# Patient Record
Sex: Female | Born: 1975 | Race: White | Hispanic: No | Smoking: Current every day smoker
Health system: Southern US, Community
[De-identification: ages and names within clinical notes are randomized; demographics above are authoritative.]

## PROBLEM LIST (undated history)

## (undated) DIAGNOSIS — F1110 Opioid abuse, uncomplicated: Secondary | ICD-10-CM

## (undated) HISTORY — PX: APPENDECTOMY: SHX54

## (undated) HISTORY — PX: CHOLECYSTECTOMY: SHX55

## (undated) HISTORY — PX: KNEE SURGERY: SHX244

---

## 2007-04-30 ENCOUNTER — Emergency Department: Payer: Self-pay | Admitting: Emergency Medicine

## 2007-05-07 ENCOUNTER — Emergency Department (HOSPITAL_COMMUNITY): Admission: EM | Admit: 2007-05-07 | Discharge: 2007-05-07 | Payer: Self-pay | Admitting: Emergency Medicine

## 2015-01-27 ENCOUNTER — Emergency Department (HOSPITAL_COMMUNITY)
Admission: EM | Admit: 2015-01-27 | Discharge: 2015-01-27 | Disposition: A | Discharge status: Home / Self Care | Payer: Self-pay | Attending: Emergency Medicine | Admitting: Emergency Medicine

## 2015-01-27 ENCOUNTER — Emergency Department (HOSPITAL_COMMUNITY): Payer: Self-pay

## 2015-01-27 ENCOUNTER — Encounter (HOSPITAL_COMMUNITY): Payer: Self-pay

## 2015-01-27 DIAGNOSIS — Y998 Other external cause status: Secondary | ICD-10-CM | POA: Insufficient documentation

## 2015-01-27 DIAGNOSIS — Z9049 Acquired absence of other specified parts of digestive tract: Secondary | ICD-10-CM | POA: Insufficient documentation

## 2015-01-27 DIAGNOSIS — G8929 Other chronic pain: Secondary | ICD-10-CM | POA: Insufficient documentation

## 2015-01-27 DIAGNOSIS — T403X1A Poisoning by methadone, accidental (unintentional), initial encounter: Secondary | ICD-10-CM | POA: Insufficient documentation

## 2015-01-27 DIAGNOSIS — T50901A Poisoning by unspecified drugs, medicaments and biological substances, accidental (unintentional), initial encounter: Secondary | ICD-10-CM

## 2015-01-27 DIAGNOSIS — K2100 Gastro-esophageal reflux disease with esophagitis, without bleeding: Secondary | ICD-10-CM

## 2015-01-27 DIAGNOSIS — Z3202 Encounter for pregnancy test, result negative: Secondary | ICD-10-CM | POA: Insufficient documentation

## 2015-01-27 DIAGNOSIS — Y9289 Other specified places as the place of occurrence of the external cause: Secondary | ICD-10-CM | POA: Insufficient documentation

## 2015-01-27 DIAGNOSIS — Y9389 Activity, other specified: Secondary | ICD-10-CM | POA: Insufficient documentation

## 2015-01-27 HISTORY — DX: Opioid abuse, uncomplicated: F11.10

## 2015-01-27 LAB — COMPREHENSIVE METABOLIC PANEL
ALBUMIN: 4.3 g/dL (ref 3.5–5.0)
ALT: 23 U/L (ref 14–54)
AST: 23 U/L (ref 15–41)
Alkaline Phosphatase: 94 U/L (ref 38–126)
Anion gap: 9 (ref 5–15)
BUN: 17 mg/dL (ref 6–20)
CHLORIDE: 106 mmol/L (ref 101–111)
CO2: 24 mmol/L (ref 22–32)
CREATININE: 1.35 mg/dL — AB (ref 0.44–1.00)
Calcium: 9.3 mg/dL (ref 8.9–10.3)
GFR calc Af Amer: 58 mL/min — ABNORMAL LOW (ref >60)
GFR, EST NON AFRICAN AMERICAN: 50 mL/min — AB (ref >60)
Glucose, Bld: 108 mg/dL — ABNORMAL HIGH (ref 65–99)
POTASSIUM: 4.7 mmol/L (ref 3.5–5.1)
SODIUM: 139 mmol/L (ref 135–145)
Total Bilirubin: 0.4 mg/dL (ref 0.3–1.2)
Total Protein: 7.8 g/dL (ref 6.5–8.1)

## 2015-01-27 LAB — CBC WITH DIFFERENTIAL/PLATELET
Basophils Absolute: 0 10*3/uL (ref 0.0–0.1)
Basophils Relative: 0 %
EOS ABS: 0 10*3/uL (ref 0.0–0.7)
EOS PCT: 0 %
HCT: 44.9 % (ref 36.0–46.0)
HEMOGLOBIN: 14.2 g/dL (ref 12.0–15.0)
LYMPHS ABS: 2.5 10*3/uL (ref 0.7–4.0)
LYMPHS PCT: 27 %
MCH: 26.2 pg (ref 26.0–34.0)
MCHC: 31.6 g/dL (ref 30.0–36.0)
MCV: 83 fL (ref 78.0–100.0)
MONOS PCT: 8 %
Monocytes Absolute: 0.8 10*3/uL (ref 0.1–1.0)
NEUTROS PCT: 65 %
Neutro Abs: 6 10*3/uL (ref 1.7–7.7)
Platelets: 245 10*3/uL (ref 150–400)
RBC: 5.41 MIL/uL — AB (ref 3.87–5.11)
RDW: 15.1 % (ref 11.5–15.5)
WBC: 9.4 10*3/uL (ref 4.0–10.5)

## 2015-01-27 LAB — BLOOD GAS, VENOUS
ACID-BASE DEFICIT: 7.5 mmol/L — AB (ref 0.0–2.0)
ACID-BASE DEFICIT: 4.2 mmol/L — AB (ref 0.0–2.0)
Acid-base deficit: 5.8 mmol/L — ABNORMAL HIGH (ref 0.0–2.0)
BICARBONATE: 24.6 meq/L — AB (ref 20.0–24.0)
BICARBONATE: 22.3 meq/L (ref 20.0–24.0)
Bicarbonate: 24.1 mEq/L — ABNORMAL HIGH (ref 20.0–24.0)
O2 SAT: 92.9 %
O2 SAT: 92.6 %
O2 Saturation: 96.6 %
PATIENT TEMPERATURE: 98.6
PCO2 VEN: 69.6 mmHg — AB (ref 45.0–50.0)
PCO2 VEN: 84.1 mmHg — AB (ref 45.0–50.0)
PCO2 VEN: 48.5 mmHg (ref 45.0–50.0)
PH VEN: 7.165 — AB (ref 7.250–7.300)
PO2 VEN: 92.4 mmHg — AB (ref 30.0–45.0)
PO2 VEN: 70.8 mmHg — AB (ref 30.0–45.0)
Patient temperature: 98.6
Patient temperature: 98.6
TCO2: 22.7 mmol/L (ref 0–100)
TCO2: 23.7 mmol/L (ref 0–100)
TCO2: 20.4 mmol/L (ref 0–100)
pH, Ven: 7.094 — CL (ref 7.250–7.300)
pH, Ven: 7.283 (ref 7.250–7.300)
pO2, Ven: 115 mmHg — ABNORMAL HIGH (ref 30.0–45.0)

## 2015-01-27 LAB — URINALYSIS, ROUTINE W REFLEX MICROSCOPIC
Bilirubin Urine: NEGATIVE
GLUCOSE, UA: NEGATIVE mg/dL
HGB URINE DIPSTICK: NEGATIVE
Ketones, ur: NEGATIVE mg/dL
LEUKOCYTES UA: NEGATIVE
Nitrite: NEGATIVE
PH: 5 (ref 5.0–8.0)
Protein, ur: NEGATIVE mg/dL
Specific Gravity, Urine: 1.012 (ref 1.005–1.030)
Urobilinogen, UA: 0.2 mg/dL (ref 0.0–1.0)

## 2015-01-27 LAB — I-STAT CG4 LACTIC ACID, ED: LACTIC ACID, VENOUS: 1.04 mmol/L (ref 0.5–2.0)

## 2015-01-27 LAB — I-STAT BETA HCG BLOOD, ED (MC, WL, AP ONLY): I-stat hCG, quantitative: <5 m[IU]/mL (ref <5)

## 2015-01-27 LAB — LIPASE, BLOOD: Lipase: 37 U/L (ref 11–51)

## 2015-01-27 MED ORDER — INFLUENZA VAC SPLIT QUAD 0.5 ML IM SUSY: 0.5000 mL | PREFILLED_SYRINGE | INTRAMUSCULAR | Status: DC

## 2015-01-27 MED ORDER — NALOXONE HCL 2 MG/2ML IJ SOSY
0.2500 mg/h | PREFILLED_SYRINGE | INTRAMUSCULAR | Status: DC
  Filled 2015-01-27: qty 4

## 2015-01-27 MED ORDER — PANTOPRAZOLE SODIUM 40 MG IV SOLR
40.0000 mg | Freq: Once | INTRAVENOUS | Status: AC
  Administered 2015-01-27: 40 mg via INTRAVENOUS
  Filled 2015-01-27: qty 40

## 2015-01-27 MED ORDER — NALOXONE HCL 0.4 MG/ML IJ SOLN
0.2000 mg | Freq: Once | INTRAMUSCULAR | Status: AC
  Administered 2015-01-27: 0.2 mg via INTRAVENOUS
  Filled 2015-01-27: qty 1

## 2015-01-27 MED ORDER — SODIUM CHLORIDE 0.9 % IV BOLUS (SEPSIS)
1000.0000 mL | Freq: Once | INTRAVENOUS | Status: AC
  Administered 2015-01-27: 1000 mL via INTRAVENOUS

## 2015-01-27 MED ORDER — IOHEXOL 300 MG/ML  SOLN
50.0000 mL | Freq: Once | INTRAMUSCULAR | Status: AC | PRN
  Administered 2015-01-27: 50 mL via ORAL

## 2015-01-27 MED ORDER — PNEUMOCOCCAL VAC POLYVALENT 25 MCG/0.5ML IJ INJ: 0.5000 mL | INJECTION | INTRAMUSCULAR | Status: DC

## 2015-01-27 MED ORDER — PANTOPRAZOLE SODIUM 20 MG PO TBEC: 20.0000 mg | DELAYED_RELEASE_TABLET | Freq: Every day | ORAL | Status: AC

## 2015-01-27 MED ORDER — ONDANSETRON HCL 4 MG/2ML IJ SOLN
4.0000 mg | Freq: Once | INTRAMUSCULAR | Status: AC
  Administered 2015-01-27: 4 mg via INTRAVENOUS
  Filled 2015-01-27: qty 2

## 2015-01-27 NOTE — ED Notes (Signed)
Pt ambulatory to the BR and back without difficulty; pt with steady gait

## 2015-01-27 NOTE — ED Notes (Signed)
Per EMS, pt from methadone clinic.  Pt x 2 years.  Pt went this morning for dosing.  Pt started vomiting after her dose.  Pt then started c/o abdominal pain.  Pt states it has been going on x 2 weeks.  Pt is prescribed xanax but denies narcotic use.  Pt is arousable to voice.  Very lethargic in route and nods off during conversation.  Vitals: 122/84, hr 88, cbg 95, 98% ra

## 2015-01-27 NOTE — Discharge Instructions (Signed)
Gastroesophageal Reflux Disease, Adult Normally, food travels down the esophagus and stays in the stomach to be digested. However, when a person has gastroesophageal reflux disease (GERD), food and stomach acid move back up into the esophagus. When this happens, the esophagus becomes sore and inflamed. Over time, GERD can create small holes (ulcers) in the lining of the esophagus.  CAUSES This condition is caused by a problem with the muscle between the esophagus and the stomach (lower esophageal sphincter, or LES). Normally, the LES muscle closes after food passes through the esophagus to the stomach. When the LES is weakened or abnormal, it does not close properly, and that allows food and stomach acid to go back up into the esophagus. The LES can be weakened by certain dietary substances, medicines, and medical conditions, including:  Tobacco use.  Pregnancy.  Having a hiatal hernia.  Heavy alcohol use.  Certain foods and beverages, such as coffee, chocolate, onions, and peppermint. RISK FACTORS This condition is more likely to develop in:  People who have an increased body weight.  People who have connective tissue disorders.  People who use NSAID medicines. SYMPTOMS Symptoms of this condition include:  Heartburn.  Difficult or painful swallowing.  The feeling of having a lump in the throat.  Abitter taste in the mouth.  Bad breath.  Having a large amount of saliva.  Having an upset or bloated stomach.  Belching.  Chest pain.  Shortness of breath or wheezing.  Ongoing (chronic) cough or a night-time cough.  Wearing away of tooth enamel.  Weight loss. Different conditions can cause chest pain. Make sure to see your health care provider if you experience chest pain. DIAGNOSIS Your health care provider will take a medical history and perform a physical exam. To determine if you have mild or severe GERD, your health care provider may also monitor how you respond  to treatment. You may also have other tests, including:  An endoscopy toexamine your stomach and esophagus with a small camera.  A test thatmeasures the acidity level in your esophagus.  A test thatmeasures how much pressure is on your esophagus.  A barium swallow or modified barium swallow to show the shape, size, and functioning of your esophagus. TREATMENT The goal of treatment is to help relieve your symptoms and to prevent complications. Treatment for this condition may vary depending on how severe your symptoms are. Your health care provider may recommend:  Changes to your diet.  Medicine.  Surgery. HOME CARE INSTRUCTIONS Diet  Follow a diet as recommended by your health care provider. This may involve avoiding foods and drinks such as:  Coffee and tea (with or without caffeine).  Drinks that containalcohol.  Energy drinks and sports drinks.  Carbonated drinks or sodas.  Chocolate and cocoa.  Peppermint and mint flavorings.  Garlic and onions.  Horseradish.  Spicy and acidic foods, including peppers, chili powder, curry powder, vinegar, hot sauces, and barbecue sauce.  Citrus fruit juices and citrus fruits, such as oranges, lemons, and limes.  Tomato-based foods, such as red sauce, chili, salsa, and pizza with red sauce.  Fried and fatty foods, such as donuts, french fries, potato chips, and high-fat dressings.  High-fat meats, such as hot dogs and fatty cuts of red and white meats, such as rib eye steak, sausage, ham, and bacon.  High-fat dairy items, such as whole milk, butter, and cream cheese.  Eat small, frequent meals instead of large meals.  Avoid drinking large amounts of liquid with your  meals.  Avoid eating meals during the 2-3 hours before bedtime.  Avoid lying down right after you eat.  Do not exercise right after you eat. General Instructions  Pay attention to any changes in your symptoms.  Take over-the-counter and prescription  medicines only as told by your health care provider. Do not take aspirin, ibuprofen, or other NSAIDs unless your health care provider told you to do so.  Do not use any tobacco products, including cigarettes, chewing tobacco, and e-cigarettes. If you need help quitting, ask your health care provider.  Wear loose-fitting clothing. Do not wear anything tight around your waist that causes pressure on your abdomen.  Raise (elevate) the head of your bed 6 inches (15cm).  Try to reduce your stress, such as with yoga or meditation. If you need help reducing stress, ask your health care provider.  If you are overweight, reduce your weight to an amount that is healthy for you. Ask your health care provider for guidance about a safe weight loss goal.  Keep all follow-up visits as told by your health care provider. This is important. SEEK MEDICAL CARE IF:  You have new symptoms.  You have unexplained weight loss.  You have difficulty swallowing, or it hurts to swallow.  You have wheezing or a persistent cough.  Your symptoms do not improve with treatment.  You have a hoarse voice. SEEK IMMEDIATE MEDICAL CARE IF:  You have pain in your arms, neck, jaw, teeth, or back.  You feel sweaty, dizzy, or light-headed.  You have chest pain or shortness of breath.  You vomit and your vomit looks like blood or coffee grounds.  You faint.  Your stool is bloody or black.  You cannot swallow, drink, or eat.   This information is not intended to replace advice given to you by your health care provider. Make sure you discuss any questions you have with your health care provider.   Document Released: 12/13/2004 Document Revised: 11/24/2014 Document Reviewed: 06/30/2014 Elsevier Interactive Patient Education 2016 [employer]  Esophagitis Esophagitis is inflammation of the esophagus. The esophagus is the tube that carries food and liquids from your mouth to your stomach. Esophagitis can cause  soreness or pain in the esophagus. This condition can make it difficult and painful to swallow.  CAUSES Most causes of esophagitis are not serious. Common causes of this condition include:  Gastroesophageal reflux disease (GERD). This is when stomach contents move back up into the esophagus (reflux).  Repeated vomiting.  An allergic-type reaction, especially caused by food allergies (eosinophilic esophagitis).  Injury to the esophagus by swallowing large pills with or without water, or swallowing certain types of medicines.  Swallowing (ingesting) harmful chemicals, such as household cleaning products.  Heavy alcohol use.  An infection of the esophagus.This most often occurs in people who have a weakened immune system.  Radiation or chemotherapy treatment for cancer.  Certain diseases such as sarcoidosis, Crohn disease, and scleroderma. SYMPTOMS Symptoms of this condition include:  Difficult or painful swallowing.  Pain with swallowing acidic liquids, such as citrus juices.  Pain with burping.  Chest pain.  Difficulty breathing.  Nausea.  Vomiting.  Pain in the abdomen.  Weight loss.  Ulcers in the mouth.  Patches of white material in the mouth (candidiasis).  Fever.  Coughing up blood or vomiting blood.  Stool that is black, tarry, or bright red. DIAGNOSIS Your health care provider will take a medical history and perform a physical exam. You may also have other  tests, including:  An endoscopy to examine your stomach and esophagus with a small camera.  A test that measures the acidity level in your esophagus.  A test that measures how much pressure is on your esophagus.  A barium swallow or modified barium swallow to show the shape, size, and functioning of your esophagus.  Allergy tests. TREATMENT Treatment for this condition depends on the cause of your esophagitis. In some cases, steroids or other medicines may be given to help relieve your symptoms  or to treat the underlying cause of your condition. You may have to make some lifestyle changes, such as:  Avoiding alcohol.  Quitting smoking.  Changing your diet.  Exercising.  Changing your sleep habits and your sleep environment. HOME CARE INSTRUCTIONS Take these actions to decrease your discomfort and to help avoid complications. Diet  Follow a diet as recommended by your health care provider. This may involve avoiding foods and drinks such as:  Coffee and tea (with or without caffeine).  Drinks that contain alcohol.  Energy drinks and sports drinks.  Carbonated drinks or sodas.  Chocolate and cocoa.  Peppermint and mint flavorings.  Garlic and onions.  Horseradish.  Spicy and acidic foods, including peppers, chili powder, curry powder, vinegar, hot sauces, and barbecue sauce.  Citrus fruit juices and citrus fruits, such as oranges, lemons, and limes.  Tomato-based foods, such as red sauce, chili, salsa, and pizza with red sauce.  Fried and fatty foods, such as donuts, french fries, potato chips, and high-fat dressings.  High-fat meats, such as hot dogs and fatty cuts of red and white meats, such as rib eye steak, sausage, ham, and bacon.  High-fat dairy items, such as whole milk, butter, and cream cheese.  Eat small, frequent meals instead of large meals.  Avoid drinking large amounts of liquid with your meals.  Avoid eating meals during the 2-3 hours before bedtime.  Avoid lying down right after you eat.  Do not exercise right after you eat.  Avoid foods and drinks that seem to make your symptoms worse. General Instructions  Pay attention to any changes in your symptoms.  Take over-the-counter and prescription medicines only as told by your health care provider. Do not take aspirin, ibuprofen, or other NSAIDs unless your health care provider told you to do so.  If you have trouble taking pills, use a pill splitter to decrease the size of the pill.  This will decrease the chance of the pill getting stuck or injuring your esophagus on the way down. Also, drink water after you take a pill.  Do not use any tobacco products, including cigarettes, chewing tobacco, and e-cigarettes. If you need help quitting, ask your health care provider.  Wear loose-fitting clothing. Do not wear anything tight around your waist that causes pressure on your abdomen.  Raise (elevate) the head of your bed about 6 inches (15 cm).  Try to reduce your stress, such as with yoga or meditation. If you need help reducing stress, ask your health care provider.  If you are overweight, reduce your weight to an amount that is healthy for you. Ask your health care provider for guidance about a safe weight loss goal.  Keep all follow-up visits as told by your health care provider. This is important. SEEK MEDICAL CARE IF:  You have new symptoms.  You have unexplained weight loss.  You have difficulty swallowing, or it hurts to swallow.  You have wheezing or a persistent cough.  Your symptoms do  not improve with treatment.  You have frequent heartburn for more than two weeks. SEEK IMMEDIATE MEDICAL CARE IF:  You have severe pain in your arms, neck, jaw, teeth, or back.  You feel sweaty, dizzy, or light-headed.  You have chest pain or shortness of breath.  You vomit and your vomit looks like blood or coffee grounds.  Your stool is bloody or black.  You have a fever.  You cannot swallow, drink, or eat.   This information is not intended to replace advice given to you by your health care provider. Make sure you discuss any questions you have with your health care provider.   Document Released: 04/12/2004 Document Revised: 11/24/2014 Document Reviewed: 06/30/2014 Elsevier Interactive Patient Education [employer]

## 2015-01-27 NOTE — ED Provider Notes (Signed)
Care assumed from Dr. Madilyn Hookees at shift change. Patient was brought here after a double dose of her methadone. She was somnolent for several hours, however is now wide awake and ambulatory throughout the department. She will be discharged with the instructions provided by Dr. Madilyn Hookees. To follow-up as needed for any problems.  Maria Lyonsouglas Areej Tayler, MD 01/27/15 2042

## 2015-01-27 NOTE — ED Notes (Signed)
Bed: WA02 Expected date: 01/27/15 Expected time:  Means of arrival:  Comments: EMS - 39 y.o. f w/abd pain

## 2015-01-27 NOTE — ED Notes (Signed)
Pt sitting up on stretcher eating dinner, pt awake and alert presently

## 2015-01-27 NOTE — ED Provider Notes (Signed)
CSN: [id number]     Arrival date & time 01/27/15  0911 History   First MD Initiated Contact with Patient 01/27/15 [phone]     Chief Complaint  Patient presents with  . Abdominal Pain  . Emesis     Patient is a 39 y.o. female presenting with abdominal pain and vomiting. The history is provided by the patient. No language interpreter was used.  Abdominal Pain Associated symptoms: vomiting   Emesis Associated symptoms: abdominal pain    Ms. Perine presents for evaluation of abdominal pain. She was at the methadone clinic this morning and received her regular dose of 160 mg. She vomited he was given a second dose of 60 mg. She was referred to the emergency department for further evaluation of her abdominal pain. Level 5 caveat due to somnolence. Patient falls asleep during history and examination multiple times. She reports several months of upper abdominal pain that comes and goes. Has a history of cholecystectomy and appendectomy.  Past Medical History  Diagnosis Date  . Narcotic abuse    Past Surgical History  Procedure Laterality Date  . Appendectomy    . Cholecystectomy    . Knee surgery     No family history on file. Social History  Substance Use Topics  . Smoking status: Not on file  . Smokeless tobacco: Not on file  . Alcohol Use: Not on file   OB History    No data available     Review of Systems  Gastrointestinal: Positive for vomiting and abdominal pain.  All other systems reviewed and are negative.     Allergies  Keflex and Sulfa antibiotics  Home Medications   Prior to Admission medications   Not on File   BP 116/75 mmHg  Pulse 80  Resp 18  SpO2 97% Physical Exam  Constitutional: She appears well-developed and well-nourished.  HENT:  Head: Normocephalic and atraumatic.  Cardiovascular: Normal rate and regular rhythm.   No murmur heard. Pulmonary/Chest: Effort normal and breath sounds normal. No respiratory distress.  Abdominal:  Moderate right  upper quadrant and epigastric tenderness with voluntary guarding  Musculoskeletal: She exhibits no edema or tenderness.  Neurological:  Lethargic, moves all extremities symmetrically.  Skin: Skin is warm and dry.  Psychiatric: She has a normal mood and affect. Her behavior is normal.  Nursing note and vitals reviewed.   ED Course  Procedures  CRITICAL CARE Performed by: Tilden Fossa   Total critical care time: 30 minutes  Critical care time was exclusive of separately billable procedures and treating other patients.  Critical care was necessary to treat or prevent imminent or life-threatening deterioration.  Critical care was time spent personally by me on the following activities: development of treatment plan with patient and/or surrogate as well as nursing, discussions with consultants, evaluation of patient's response to treatment, examination of patient, obtaining history from patient or surrogate, ordering and performing treatments and interventions, ordering and review of laboratory studies, ordering and review of radiographic studies, pulse oximetry and re-evaluation of patient's condition.  Labs Review Labs Reviewed  COMPREHENSIVE METABOLIC PANEL - Abnormal; Notable for the following:    Glucose, Bld 108 (*)    Creatinine, Ser 1.35 (*)    GFR calc non Af Amer 50 (*)    GFR calc Af Amer 58 (*)    All other components within normal limits  CBC WITH DIFFERENTIAL/PLATELET - Abnormal; Notable for the following:    RBC 5.41 (*)    All other components within  normal limits  BLOOD GAS, VENOUS - Abnormal; Notable for the following:    pH, Ven 7.165 (*)    pCO2, Ven 69.6 (*)    pO2, Ven 115.0 (*)    Bicarbonate 24.1 (*)    Acid-base deficit 5.8 (*)    All other components within normal limits  BLOOD GAS, VENOUS - Abnormal; Notable for the following:    pH, Ven 7.094 (*)    pCO2, Ven 84.1 (*)    pO2, Ven 92.4 (*)    Bicarbonate 24.6 (*)    Acid-base deficit 7.5 (*)     All other components within normal limits  BLOOD GAS, VENOUS - Abnormal; Notable for the following:    pO2, Ven 70.8 (*)    Acid-base deficit 4.2 (*)    All other components within normal limits  LIPASE, BLOOD  URINALYSIS, ROUTINE W REFLEX MICROSCOPIC (NOT AT [facility])  I-STAT BETA HCG BLOOD, ED ([facility], [facility], [facility] ONLY)  I-STAT CG4 LACTIC ACID, ED    Imaging Review Ct Abdomen Pelvis Wo Contrast  01/27/2015  CLINICAL DATA:  39 year old with 2 week history of right upper quadrant and epigastric abdominal pain. Current history of narcotic abuse for which the patient has been on methadone therapy for 2 years. EXAM: CT ABDOMEN AND PELVIS WITHOUT CONTRAST TECHNIQUE: Multidetector CT imaging of the abdomen and pelvis was performed following the standard protocol without IV contrast. COMPARISON:  None. FINDINGS: Lower chest:  Heart size normal.  Visualized lung bases clear. Hepatobiliary: Normal unenhanced appearance of the liver. Gallbladder surgically absent. No unexpected biliary ductal dilation. Pancreas: Normal unenhanced appearance. Spleen: Normal unenhanced appearance. Adrenals/Urinary Tract: Normal adrenal glands. No evidence of urinary tract calculi or obstruction on either side. Within the limits of the unenhanced technique, no focal parenchymal abnormality involving either kidney. Normal-appearing decompressed urinary bladder. Stomach/Bowel: Small hiatal hernia. Thickening of the wall of the distal esophagus at the esophagogastric junction. Normal-appearing small bowel. Normal-appearing colon with expected stool burden. Surgically absent appendix. No ascites. Vascular/Lymphatic: No pathologic lymphadenopathy. No visible aortoiliofemoral atherosclerosis. Reproductive: Normal-appearing uterus and ovaries without evidence of adnexal mass. Other: None. Musculoskeletal: Regional skeleton intact without acute or significant osseous abnormality. IMPRESSION: 1. Wall thickening involving the distal esophagus at the  esophagogastric junction indicating esophagitis and/or chronic GE reflux disease. 2. Small hiatal hernia. 3. No acute abnormalities involving the abdomen or pelvis. Electronically Signed   By: Hulan Saashomas  Lawrence M.D.   On: 01/27/2015 13:51   I have personally reviewed and evaluated these images and lab results as part of my medical decision-making.   EKG Interpretation None      MDM   Final diagnoses:  Accidental overdose, initial encounter  Gastroesophageal reflux disease with esophagitis    Patient here for methadone clinic for evaluation of acute on chronic abdominal pain. She was just a second time at the methadone clinic and significantly somnolent on examination. Initially she was arousable to verbal stimuli but became increasingly somnolent with hypercapnia and hypoxia. She was given Narcan for reversal. Following the Narcan patient's conversant but would fall asleep at times. She would continue to respond to verbal stimuli. Again to observe in the emergency department for awakening. In terms of her abdominal pain it is difficult to fully evaluate as patient is inconsistent in her history. CT scan demonstrates esophagitis, discussed the patient findings and need for outpatient follow-up, starting on Protonix. Discussed outpatient follow-up, return precautions.    Tilden FossaElizabeth Jaelah Hauth, MD 01/28/15 [phone]
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
## 2015-01-27 NOTE — Progress Notes (Signed)
Patient listed as not having a pcp or insurance.  EDCM spoke to patient at bedside.  [place]  Patient [facility].  [facility]  Patient reports the only medications she has are through her psychiatrist and they are covered with a three dollar program.  Patient also reports her mother assists with the costs of her medications.  Patient reports she does not have any trouble with transportation because she drives.  Patient reports she cannot afford [employer] care and has tried applying for Medicaid but was not approved.  No further EDCM needs at this time.

## 2015-01-27 NOTE — ED Notes (Signed)
[phone] Loraine LericheMark (friend)

## 2015-01-27 NOTE — ED Notes (Signed)
Placed pt on 2L of O2. Pt sats were dropping to mid 80s when she would fall asleep.

## 2015-01-27 NOTE — ED Notes (Signed)
Pt carried on a conversation w/ this RN while Protonix was being administered.  Pt immediately slumped over and fell asleep when this RN left the room.  Pt easily arousable, but will immediately fall back to sleep w/o stimulation.

## 2015-01-27 NOTE — ED Notes (Signed)
Pt given crackers, cheese, and Coke, per MD.
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
## 2015-01-27 NOTE — ED Notes (Addendum)
Case Management at bedside.

## 2015-01-27 NOTE — ED Notes (Signed)
Bed: WA16 Expected date:  Expected time:  Means of arrival:  Comments: RES A 

## 2015-01-27 NOTE — ED Notes (Signed)
Pt ambulated to restroom w/o difficulty.

## 2015-01-27 NOTE — ED Notes (Signed)
Pt provided ice chips per MD

## 2017-05-06 IMAGING — CT CT ABD-PELV W/O CM
2 of 4 series · 16 of 46 positions shown, 18 images · non-contrast
Comparison: None.

CLINICAL DATA: 38-year-old with 2 week history of right upper
quadrant and epigastric abdominal pain. Current history of narcotic
abuse for which the patient has been on methadone therapy for 2
years.

EXAM:
CT ABDOMEN AND PELVIS WITHOUT CONTRAST
TECHNIQUE: Multidetector CT imaging of the abdomen and pelvis was performed
following the standard protocol without IV contrast.

[Series 2: abd/pel w/o · axial · non-contrast · 0.68mm/px · z∈[-429,-24]mm · 13 of 89 slices shown, 15 images]
[im 4/89  soft-tissue]
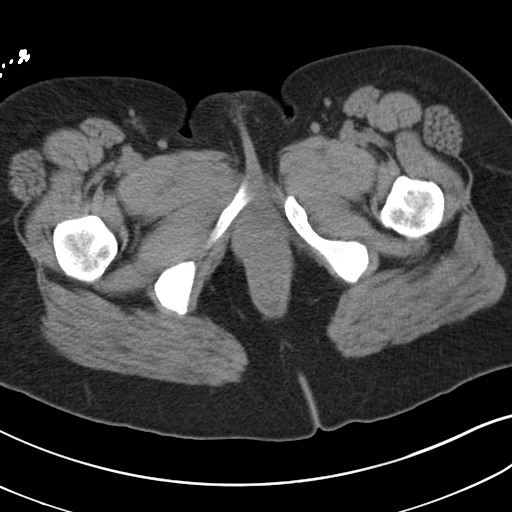
[im 4/89  bone]
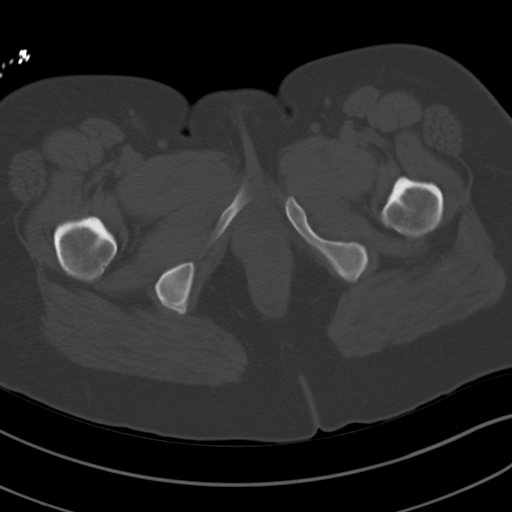
[im 12/89  soft-tissue]
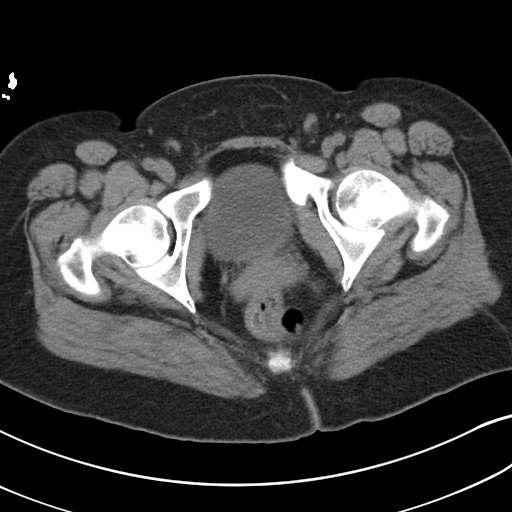
[im 19/89  soft-tissue]
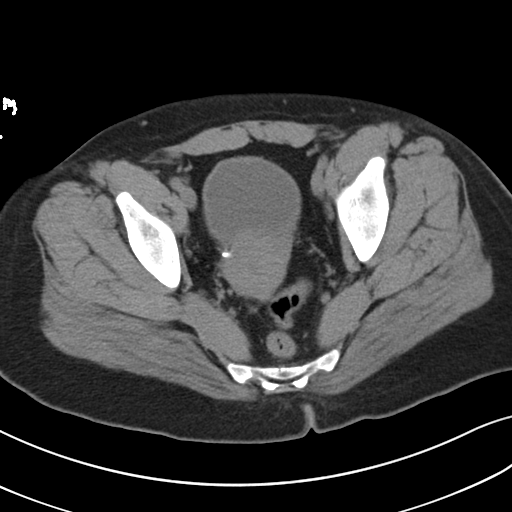
[im 26/89  soft-tissue]
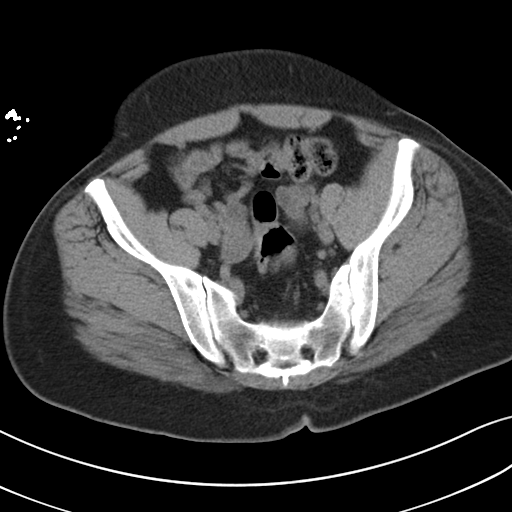
[im 30/89  soft-tissue]
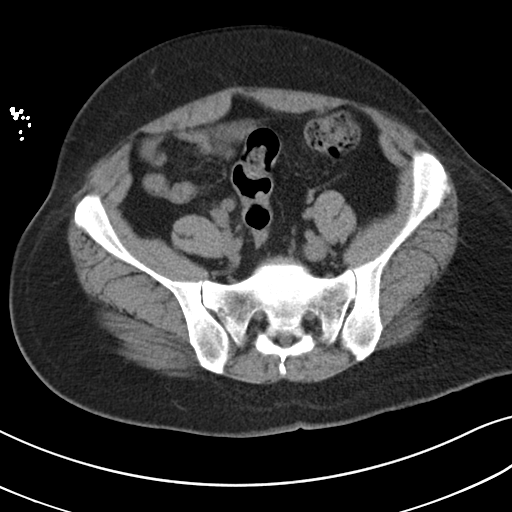
[im 37/89  soft-tissue]
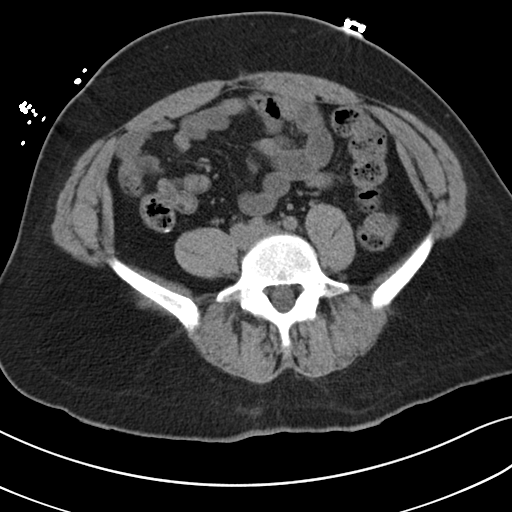
[im 45/89  soft-tissue]
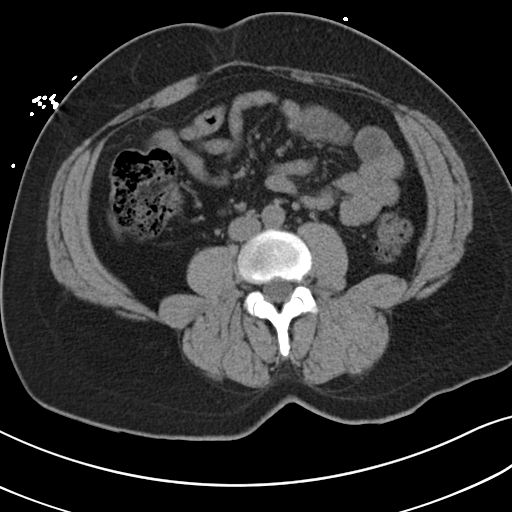
[im 52/89  soft-tissue]
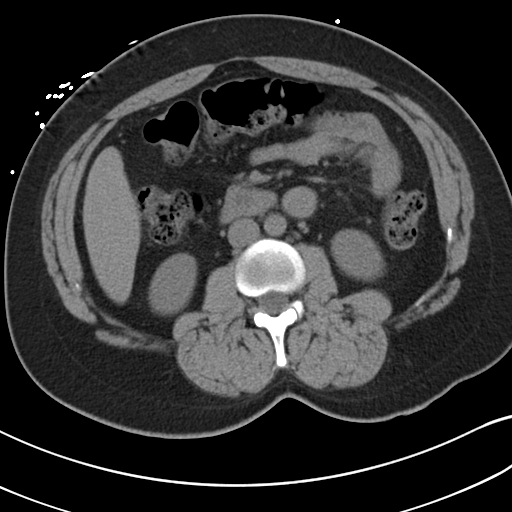
[im 59/89  soft-tissue]
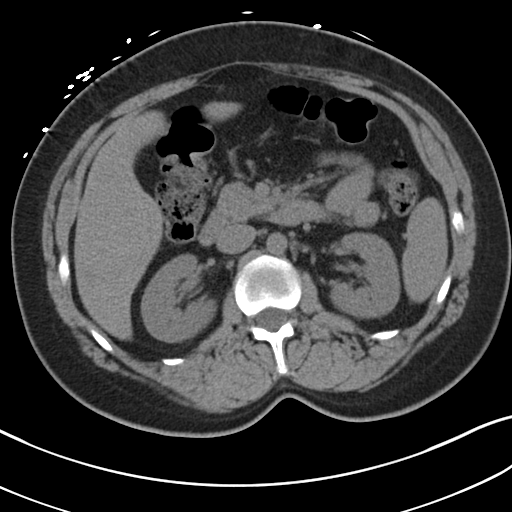
[im 59/89  bone]
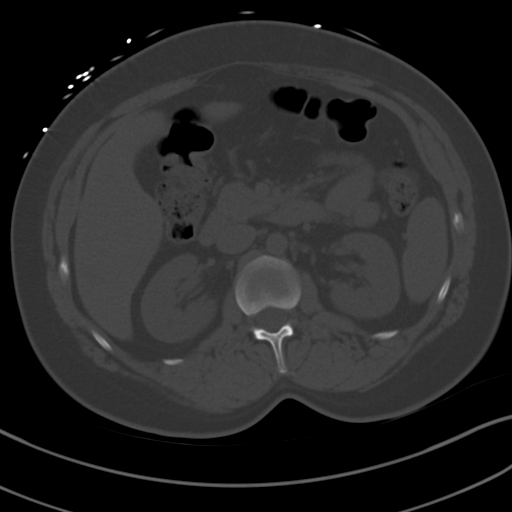
[im 63/89  soft-tissue]
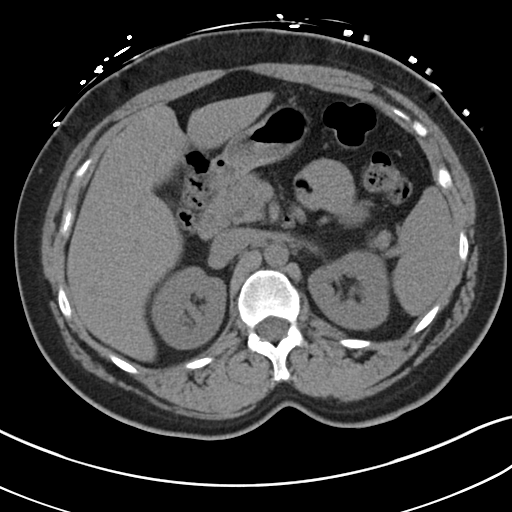
[im 70/89  soft-tissue]
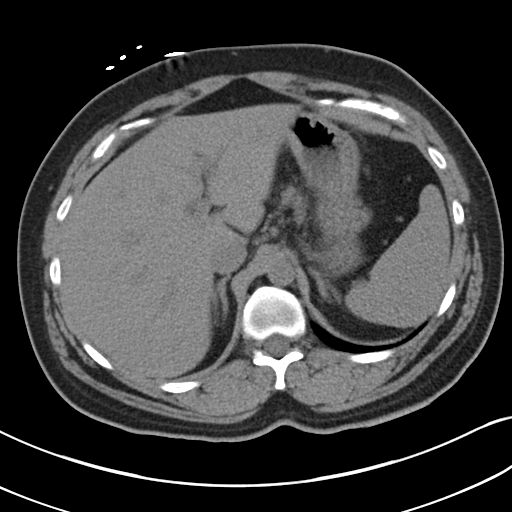
[im 78/89  soft-tissue]
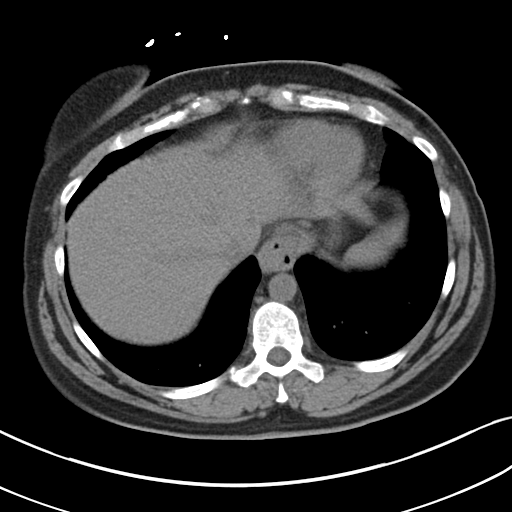
[im 85/89  soft-tissue]
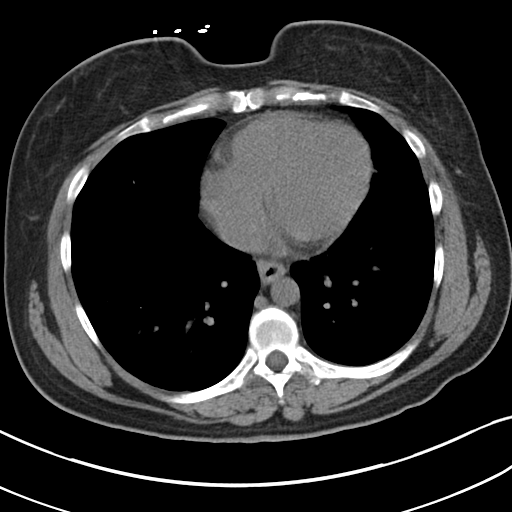

[Series 5: coronal · coronal · 0.74mm/px · 3 of 99 slices shown]
[im 33/99  soft-tissue]
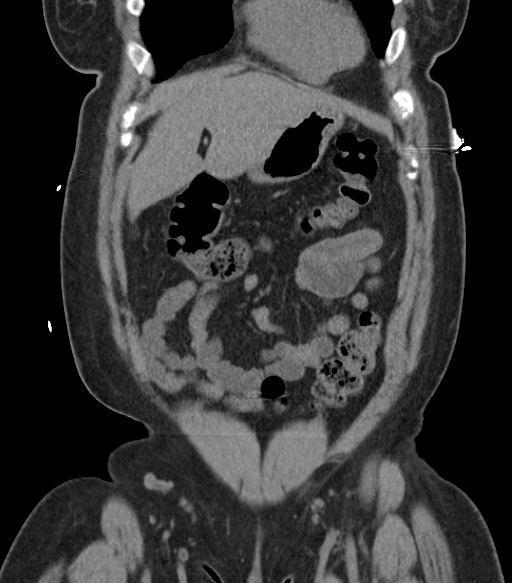
[im 44/99  soft-tissue]
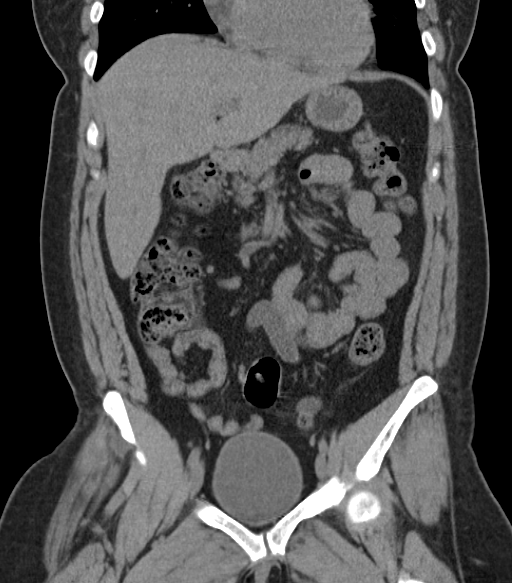
[im 55/99  soft-tissue]
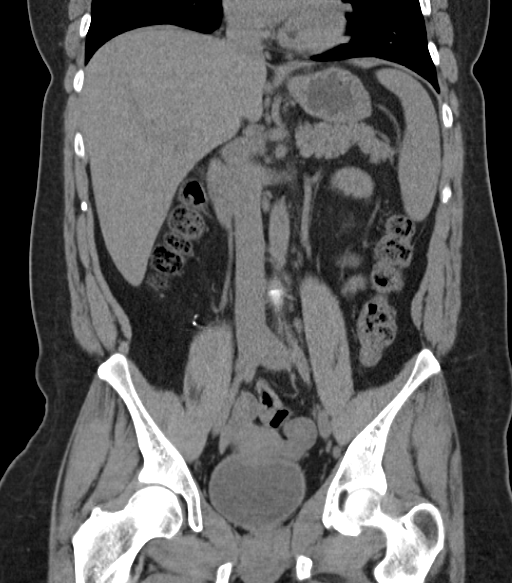

[16 of 46 positions shown; findings below may reference images not displayed]

FINDINGS: Lower chest:  Heart size normal.  Visualized lung bases clear.

Hepatobiliary: Normal unenhanced appearance of the liver.
Gallbladder surgically absent. No unexpected biliary ductal
dilation.

Pancreas: Normal unenhanced appearance.

Spleen: Normal unenhanced appearance.

Adrenals/Urinary Tract: Normal adrenal glands. No evidence of
urinary tract calculi or obstruction on either side. Within the
limits of the unenhanced technique, no focal parenchymal abnormality
involving either kidney. Normal-appearing decompressed urinary
bladder.

Stomach/Bowel: Small hiatal hernia. Thickening of the wall of the
distal esophagus at the esophagogastric junction. Normal-appearing
small bowel. Normal-appearing colon with expected stool burden.
Surgically absent appendix. No ascites.

Vascular/Lymphatic: No pathologic lymphadenopathy. No visible
aortoiliofemoral atherosclerosis.

Reproductive: Normal-appearing uterus and ovaries without evidence
of adnexal mass.

Other: None.

Musculoskeletal: Regional skeleton intact without acute or
significant osseous abnormality.
IMPRESSION: 1. Wall thickening involving the distal esophagus at the
esophagogastric junction indicating esophagitis and/or chronic GE
reflux disease.
2. Small hiatal hernia.
3. No acute abnormalities involving the abdomen or pelvis.

## 2023-05-18 DEATH — deceased
